# Patient Record
Sex: Female | Born: 1985 | Race: White | Hispanic: No | Marital: Married | Smoking: Never smoker
Health system: Southern US, Community
[De-identification: ages and names within clinical notes are randomized; demographics above are authoritative.]

## PROBLEM LIST (undated history)

## (undated) DIAGNOSIS — N946 Dysmenorrhea, unspecified: Secondary | ICD-10-CM

## (undated) DIAGNOSIS — K3184 Gastroparesis: Secondary | ICD-10-CM

## (undated) DIAGNOSIS — F419 Anxiety disorder, unspecified: Secondary | ICD-10-CM

## (undated) DIAGNOSIS — N809 Endometriosis, unspecified: Secondary | ICD-10-CM

## (undated) DIAGNOSIS — I499 Cardiac arrhythmia, unspecified: Secondary | ICD-10-CM

## (undated) HISTORY — DX: Dysmenorrhea, unspecified: N94.6

## (undated) HISTORY — DX: Endometriosis, unspecified: N80.9

## (undated) HISTORY — DX: Cardiac arrhythmia, unspecified: I49.9

## (undated) HISTORY — DX: Anxiety disorder, unspecified: F41.9

## (undated) HISTORY — PX: PELVIC LAPAROSCOPY: SHX162

---

## 2007-03-22 ENCOUNTER — Ambulatory Visit (HOSPITAL_COMMUNITY): Admission: RE | Admit: 2007-03-22 | Discharge: 2007-03-22 | Payer: Self-pay | Admitting: Gastroenterology

## 2007-04-20 ENCOUNTER — Encounter: Admission: RE | Admit: 2007-04-20 | Discharge: 2007-04-20 | Payer: Self-pay | Admitting: Gastroenterology

## 2007-04-21 ENCOUNTER — Encounter: Admission: RE | Admit: 2007-04-21 | Discharge: 2007-04-21 | Payer: Self-pay | Admitting: Gastroenterology

## 2010-02-10 ENCOUNTER — Encounter: Payer: Self-pay | Admitting: Gastroenterology

## 2010-06-04 NOTE — Op Note (Signed)
NAMEDALENE, ROBARDS               ACCOUNT NO.:  [id number]   MEDICAL RECORD NO.:  [id number]          PATIENT TYPE:  AMB   LOCATION:  ENDO                         FACILITY:  [facility]   PHYSICIAN:  Shirley Friar, MDDATE OF BIRTH:  26-Sep-1985   DATE OF PROCEDURE:  DATE OF DISCHARGE:                               OPERATIVE REPORT   PROCEDURES:  1. Upper endoscopy.  2. Bravo capsule placement.   INDICATIONS:  Heartburn.   MEDICATIONS:  Fentanyl 100 mcg IV, Versed 8 mg IV, Phenergan 12.5 mg IV.   FINDINGS:  Endoscope was inserted through the oropharynx and the  esophagus was intubated, which was normal in its entirety.  The  endoscope was advanced down to the stomach, which revealed normal  stomach mucosa.  Retroflexion was done, which revealed normal proximal  stomach.  Endoscope was straightened and advanced to the duodenal bulb  and down to the second portion of the duodenum, which were both normal.  Endoscope was withdrawn back into the esophagus and the GE junction was  noted be 40 cm from incisors.  The endoscope was then withdrawn and the  Bravo capsule catheter was inserted.  The Bravo capsule catheter was  advanced to 34 cm at the incisors and the capsule was deployed in the  usual fashion.  The catheter was withdrawn and the endoscope was  reinserted to confirm adequate placement of the Bravo capsule without  any immediate complications.   ASSESSMENT:  1. Normal upper endoscopy.  2. Status post Bravo capsule placement at 34 cm from incisors.   PLAN:  Follow up on Bravo results with Bravo procedure being done off of  proton pump inhibitor therapy.      Shirley Friar, MD  Electronically Signed     VCS/MEDQ  D:  03/22/2007  T:  03/23/2007  Job:  [id number]   cc:   Chales Salmon. Abigail Miyamoto, M.D.  Fax: [phone]

## 2011-02-20 ENCOUNTER — Ambulatory Visit (HOSPITAL_COMMUNITY)
Admission: RE | Admit: 2011-02-20 | Discharge: 2011-02-20 | Disposition: A | Discharge status: Home / Self Care | Payer: PRIVATE HEALTH INSURANCE | Source: Ambulatory Visit | Attending: Obstetrics and Gynecology | Admitting: Obstetrics and Gynecology

## 2011-02-20 NOTE — Progress Notes (Addendum)
Adult Lactation Consultation Outpatient Visit Note  Patient Name: Jeanne Conway "Jeanne Conway" Date of Birth: [birth year]  01/28/11 Gestational Age at Delivery: Unknown 38 weeks Type of Delivery: Cesarean  Breastfeeding History: Frequency of Breastfeeding: every 2 hrs  Length of Feeding: 10 mins each breast Voids: QS Stools: QS yellow seedy     Consultation Evaluation:  Initial Feeding Assessment: Pre-feed Weight: 3530  Post-feed Weight: 3582 Amount Transferred: 52 ml Comments: right breast.  When Jeanne Conway started to latch Jeanne Conway, her fingers were up close to nipple sandwiching the areola.  Showed Mom how to support, and sandwich breast closer to chest wall.  Jeanne Conway latched deeper and easier.  Demonstrated alternating breast compression to increase milk flow.  Mom had been feeding baby 10 mins per side.  Encouraged her to watch baby, not the clock, and to let Jeanne Conway end the feeding on the first side, burp and offer the second.  Baby has gained about 13 oz. In 18 days.  To feed more often during the night, as baby can sleep 5 hr stretches.    Additional Feeding Assessment: Pre-feed Weight: 3582 Post-feed Weight: 3616 Amount Transferred: 32 ml Comments: left breast  Total Breast milk Transferred this Visit: 84 ml.    Baby transferring milk well.  Mom comes in for OP visit with her 12 week old infant Jeanne Conway.  She is complaining of sore nipples, even after readjusting Jeanne Conway on the breast.  She states that initially baby caused soreness on latch only, but not it is painful most every feeding for last week.  Mom describes shooting pains during and after feedings.  Both nipples without trauma, but very pink compared to areola.  Mom has a plentiful milk supply, and frequently has soaking wet bra pads.  Mom just finished treatment for a vaginal yeast infection.  Baby has thick white coating on her mouth, and a slight bumpy diaper rash.  We discussed the possibility of a yeast overgrowth on her  nipples, and in baby's mouth.  Treatment of choice is Gentian Violet (Dr. Yevette Edwards protocol given).  Talked about probiotics, and dietary restrictions.  Jeanne Conway given handout on yeast treatment with lots of information.       Follow-Up  Call us prn    [name] 02/20/2011, 10:52 AM

## 2016-04-15 ENCOUNTER — Emergency Department (HOSPITAL_COMMUNITY)
Admission: EM | Admit: 2016-04-15 | Discharge: 2016-04-15 | Disposition: A | Discharge status: Home / Self Care | Payer: PRIVATE HEALTH INSURANCE | Attending: Emergency Medicine | Admitting: Emergency Medicine

## 2016-04-15 ENCOUNTER — Encounter (HOSPITAL_COMMUNITY): Payer: Self-pay | Admitting: *Deleted

## 2016-04-15 ENCOUNTER — Emergency Department (HOSPITAL_COMMUNITY): Payer: PRIVATE HEALTH INSURANCE

## 2016-04-15 DIAGNOSIS — R519 Headache, unspecified: Secondary | ICD-10-CM

## 2016-04-15 DIAGNOSIS — R55 Syncope and collapse: Secondary | ICD-10-CM

## 2016-04-15 DIAGNOSIS — R51 Headache: Secondary | ICD-10-CM | POA: Insufficient documentation

## 2016-04-15 HISTORY — DX: Gastroparesis: K31.84

## 2016-04-15 LAB — BASIC METABOLIC PANEL
ANION GAP: 9 (ref 5–15)
BUN: 10 mg/dL (ref 6–20)
CALCIUM: 9.4 mg/dL (ref 8.9–10.3)
CO2: 26 mmol/L (ref 22–32)
CREATININE: 0.56 mg/dL (ref 0.44–1.00)
Chloride: 101 mmol/L (ref 101–111)
GFR calc non Af Amer: >60 mL/min (ref >60)
Glucose, Bld: 149 mg/dL — ABNORMAL HIGH (ref 65–99)
Potassium: 3.3 mmol/L — ABNORMAL LOW (ref 3.5–5.1)
SODIUM: 136 mmol/L (ref 135–145)

## 2016-04-15 LAB — CBC
HCT: 37.7 % (ref 36.0–46.0)
HEMOGLOBIN: 13 g/dL (ref 12.0–15.0)
MCH: 31.3 pg (ref 26.0–34.0)
MCHC: 34.5 g/dL (ref 30.0–36.0)
MCV: 90.6 fL (ref 78.0–100.0)
PLATELETS: 301 10*3/uL (ref 150–400)
RBC: 4.16 MIL/uL (ref 3.87–5.11)
RDW: 11.8 % (ref 11.5–15.5)
WBC: 8.7 10*3/uL (ref 4.0–10.5)

## 2016-04-15 LAB — I-STAT BETA HCG BLOOD, ED (MC, WL, AP ONLY)

## 2016-04-15 MED ORDER — METOCLOPRAMIDE HCL 5 MG/ML IJ SOLN
10.0000 mg | Freq: Once | INTRAMUSCULAR | Status: AC
  Administered 2016-04-15: 10 mg via INTRAVENOUS
  Filled 2016-04-15: qty 2

## 2016-04-15 MED ORDER — KETOROLAC TROMETHAMINE 30 MG/ML IJ SOLN
30.0000 mg | Freq: Once | INTRAMUSCULAR | Status: AC
  Administered 2016-04-15: 30 mg via INTRAVENOUS
  Filled 2016-04-15: qty 1

## 2016-04-15 MED ORDER — METOCLOPRAMIDE HCL 10 MG PO TABS: 10.0000 mg | ORAL_TABLET | Freq: Four times a day (QID) | ORAL | 0 refills | Status: DC

## 2016-04-15 MED ORDER — SODIUM CHLORIDE 0.9 % IV SOLN
Freq: Once | INTRAVENOUS | Status: AC
  Administered 2016-04-15: 100 mL/h via INTRAVENOUS

## 2016-04-15 MED ORDER — DIPHENHYDRAMINE HCL 50 MG/ML IJ SOLN
12.5000 mg | Freq: Once | INTRAMUSCULAR | Status: AC
  Administered 2016-04-15: 12.5 mg via INTRAVENOUS
  Filled 2016-04-15: qty 1

## 2016-04-15 NOTE — ED Provider Notes (Signed)
[facility]-EMERGENCY DEPT Provider Note   CSN: [id number] Arrival date & time: 04/15/16  1522     History   Chief Complaint Chief Complaint  Patient presents with  . Near Syncope    HPI Jeanne Conway is a 31 y.o. female.  This a 31 year old female who presents with a near syncopal episode.  She states that she's had headache all day and when she stood up she became very shaky/visibly shaking to bystanders, as well as feeling like she was going to pass out.  EMS was called again when she attempted to stand.  She became shaky, nausea, increased and she had one episode of vomiting. Patient states that she normally gets headaches but is normally able to shake them off this one has been unrelenting.  All day.  His frontal and the back of her neck school without any visual disturbances.  She has not taken any medication for her headache.  Patient denied any chest pain, irregular heartbeat, visual changes, shortness of breath, diaphoresis. She states that she had not eaten anything except a few french fries all day EMS checked her blood sugar on arrival and was within normal parameters. Patient has a strong family history on her parental side of TIA, stroke, type 2 diabetes Denies any DVT/PE risk factors-no hormone replacement therapy, no recent travel, no trauma or injuries      Past Medical History:  Diagnosis Date  . Gastroparesis     There are no active problems to display for this patient.   History reviewed. No pertinent surgical history.  OB History    No data available       Home Medications    Prior to Admission medications   Medication Sig Start Date End Date Taking? Authorizing Provider  metoCLOPramide (REGLAN) 10 MG tablet Take 1 tablet (10 mg total) by mouth every 6 (six) hours. 04/15/16   Earley FavorGail Kaytlynn Kochan, NP    Family History History reviewed. No pertinent family history.  Social History Social History  Substance Use Topics  . Smoking status: Never Smoker  .  Smokeless tobacco: Never Used  . Alcohol use Not on file     Allergies   Patient has no allergy information on record.   Review of Systems Review of Systems  Constitutional: Negative for chills and fever.  Respiratory: Negative for shortness of breath.   Cardiovascular: Negative for chest pain and leg swelling.  Gastrointestinal: Positive for nausea. Negative for abdominal pain.  Genitourinary: Negative for dysuria.  Musculoskeletal: Negative for myalgias and neck pain.  Neurological: Positive for syncope and headaches. Negative for speech difficulty and weakness.  All other systems reviewed and are negative.    Physical Exam Updated Vital Signs BP 119/82   Pulse 72   Temp 98.1 F (36.7 C) (Oral)   Resp 18   Ht 5\' 5"  (1.651 m)   Wt 70.8 kg   LMP 04/07/2016 (Approximate)   SpO2 99%   BMI 25.96 kg/m   Physical Exam  Constitutional: She appears well-developed and well-nourished. No distress.  HENT:  Head: Normocephalic.  Right Ear: External ear normal.  Left Ear: External ear normal.  Eyes: Pupils are equal, round, and reactive to light.  Neck: Normal range of motion.  Cardiovascular: Normal rate and regular rhythm.   Pulmonary/Chest: Effort normal.  Abdominal: Soft.  Musculoskeletal: Normal range of motion.  Neurological: She is alert.  Skin: Skin is warm.  Psychiatric: She has a normal mood and affect.  Nursing note and vitals reviewed.  ED Treatments / Results  Labs (all labs ordered are listed, but only abnormal results are displayed) Labs Reviewed  BASIC METABOLIC PANEL - Abnormal; Notable for the following:       Result Value   Potassium 3.3 (*)    Glucose, Bld 149 (*)    All other components within normal limits  CBC  URINALYSIS, ROUTINE W REFLEX MICROSCOPIC  I-STAT BETA HCG BLOOD, ED ([facility], [facility], [facility] ONLY)  CBG MONITORING, ED    EKG  EKG Interpretation None       Radiology Ct Head Wo Contrast  Result Date: 04/15/2016 CLINICAL DATA:   Headache, syncope EXAM: CT HEAD WITHOUT CONTRAST TECHNIQUE: Contiguous axial images were obtained from the base of the skull through the vertex without intravenous contrast. COMPARISON:  None. FINDINGS: Brain: No intracranial hemorrhage, mass effect or midline shift. No acute cortical infarction. No hydrocephalus. No mass lesion is noted on this unenhanced scan. The gray and white-matter differentiation is preserved. No intra or extra-axial fluid collection. Vascular: No hyperdense vessel or unexpected calcification. Skull: Normal. Negative for fracture or focal lesion. Sinuses/Orbits: No acute finding. Other: None. IMPRESSION: No acute intracranial abnormality. Electronically Signed   By: Natasha Mead M.D.   On: 04/15/2016 21:02    Procedures Procedures (including critical care time)  Medications Ordered in ED Medications  0.9 %  sodium chloride infusion (100 mL/hr Intravenous New Bag/Given 04/15/16 2109)  metoCLOPramide (REGLAN) injection 10 mg (10 mg Intravenous Given 04/15/16 2109)  diphenhydrAMINE (BENADRYL) injection 12.5 mg (12.5 mg Intravenous Given 04/15/16 2109)  ketorolac (TORADOL) 30 MG/ML injection 30 mg (30 mg Intravenous Given 04/15/16 2109)     Initial Impression / Assessment and Plan / ED Course  I have reviewed the triage vital signs and the nursing notes.  Pertinent labs & imaging results that were available during my care of the patient were reviewed by me and considered in my medical decision making (see chart for details).      Patient reports that when she got out of the bed to sit in a wheelchair for CT scan.  She became lightheaded Review of labs and head CT within normal parameters.  Patient was given headache cocktail and some IV fluid after which she slept for approximately one hour and is now back to baseline.  Ambulated without any return of symptoms  Final Clinical Impressions(s) / ED Diagnoses   Final diagnoses:  Near syncope  Acute nonintractable headache,  unspecified headache type    New Prescriptions New Prescriptions   METOCLOPRAMIDE (REGLAN) 10 MG TABLET    Take 1 tablet (10 mg total) by mouth every 6 (six) hours.     Earley Favor, NP 04/15/16 6962    Mancel Bale, MD 04/16/16 [phone]

## 2016-04-15 NOTE — ED Notes (Signed)
Patient transported to CT 

## 2016-04-15 NOTE — Discharge Instructions (Signed)
Glad your feeling better If you decide to investigate your headaches further you have been given a referral the [facility] You have been given a prescription for Reglan that you can take at the onset of headaches with 12.5 mg Benadryl this is usually enough to stop the headache and nausea  Follow up with your PCP as needed

## 2016-04-15 NOTE — ED Triage Notes (Signed)
Pt in c/o dizziness and fatigue that started today, states she is shaking and cannot stop, also had near syncopal episode today. Friends thought maybe her glucose was low but when checked it was in the 100's. Alert and oriented, no distress noted

## 2017-04-01 IMAGING — CT CT HEAD W/O CM
4 series · 16 of 47 positions shown, 18 images · non-contrast
Comparison: None.

CLINICAL DATA: Headache, syncope

EXAM:
CT HEAD WITHOUT CONTRAST
TECHNIQUE: Contiguous axial images were obtained from the base of the skull
through the vertex without intravenous contrast.

[Series 3: head without · axial · non-contrast · 0.40mm/px · z∈[-215,-95]mm · 7 of 32 slices shown, 9 images]
[im 4/32  brain]
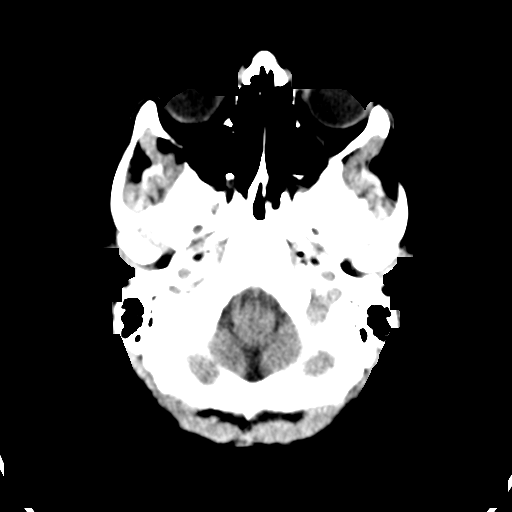
[im 4/32  bone]
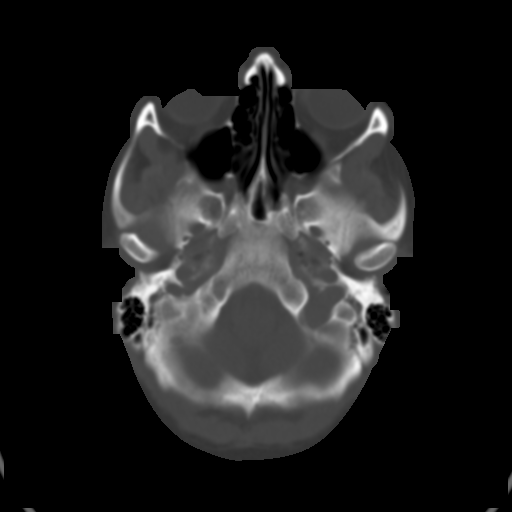
[im 8/32  brain]
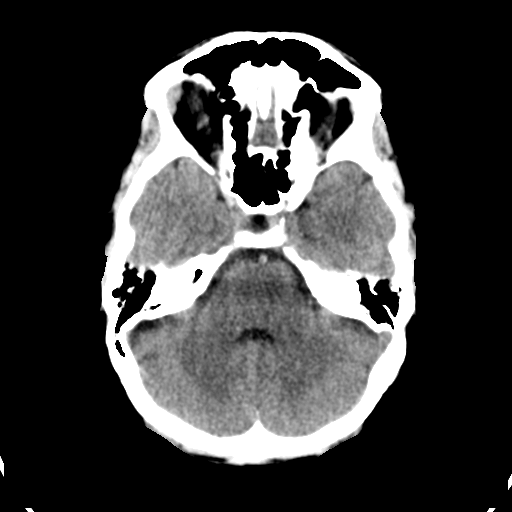
[im 12/32  brain]
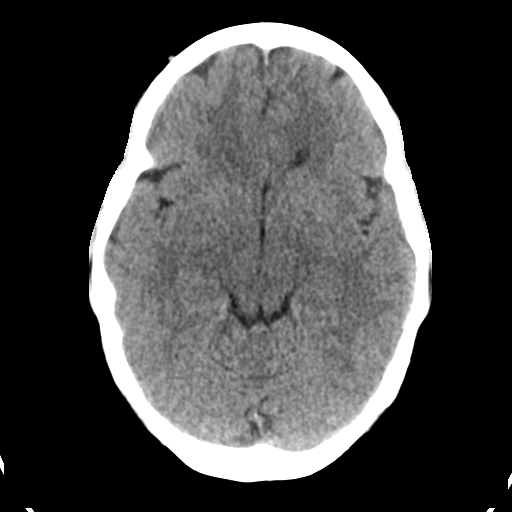
[im 16/32  brain]
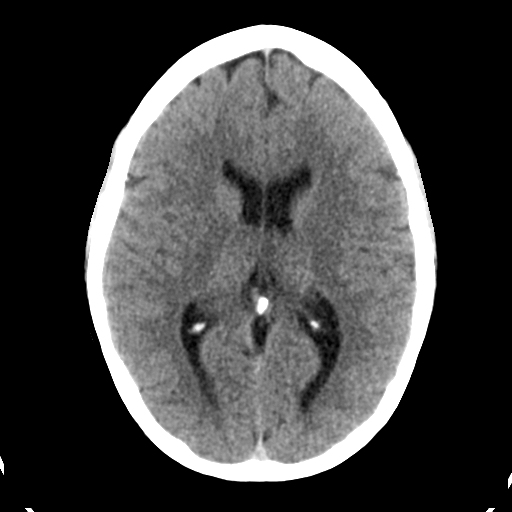
[im 20/32  brain]
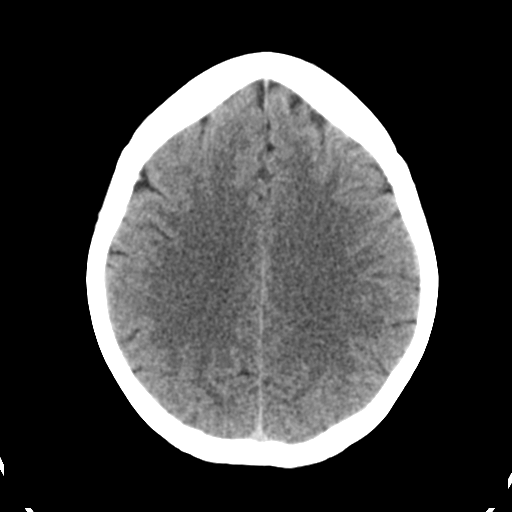
[im 20/32  bone]
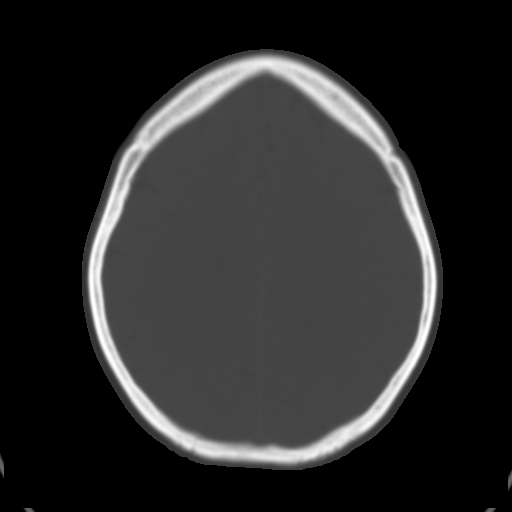
[im 24/32  brain]
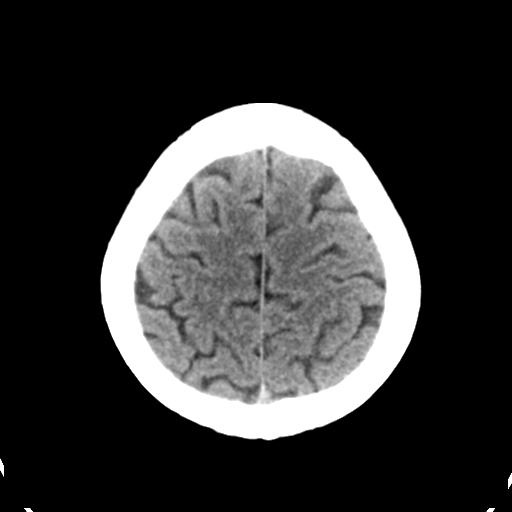
[im 28/32  brain]
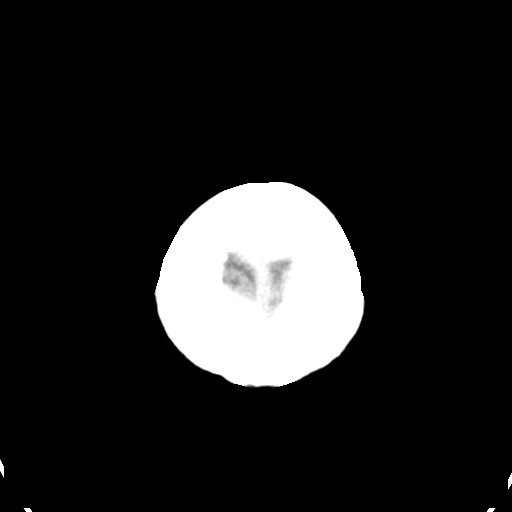

[Series 4: head bone · axial · 0.40mm/px · z∈[-216,-184]mm · 3 of 79 slices shown]
[im 8/79  bone]
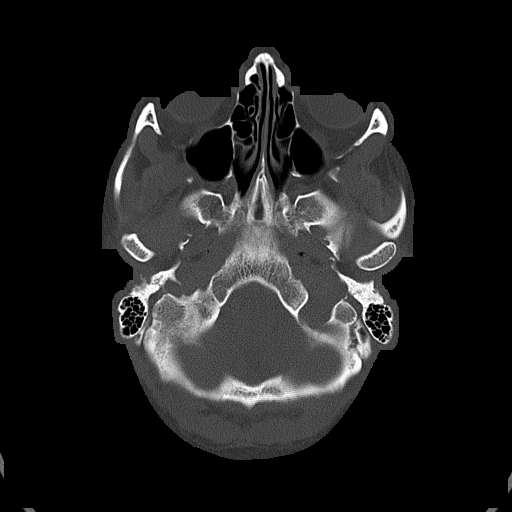
[im 16/79  bone]
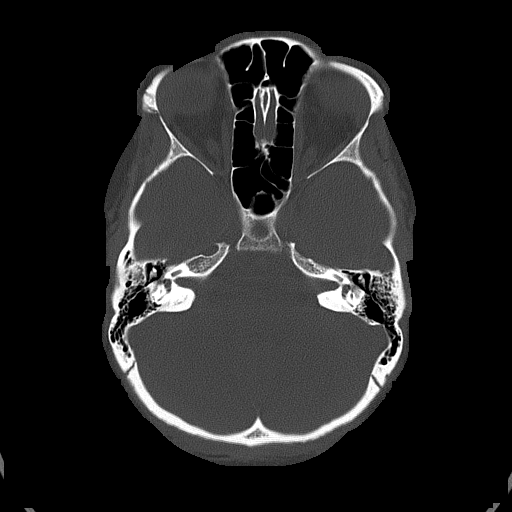
[im 24/79  bone]
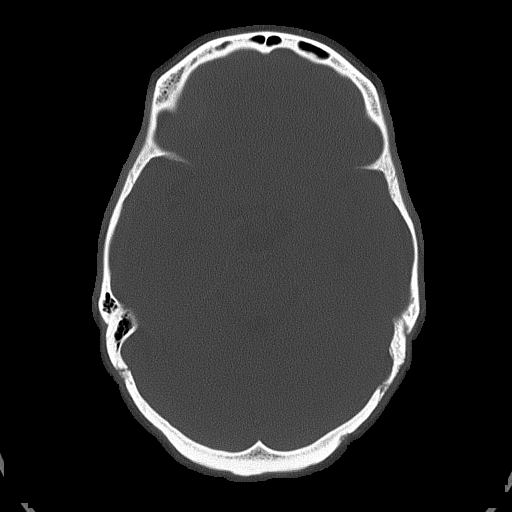

[Series 5: head without cor · coronal · non-contrast · 0.31mm/px · 3 of 67 slices shown]
[im 23/67  brain]
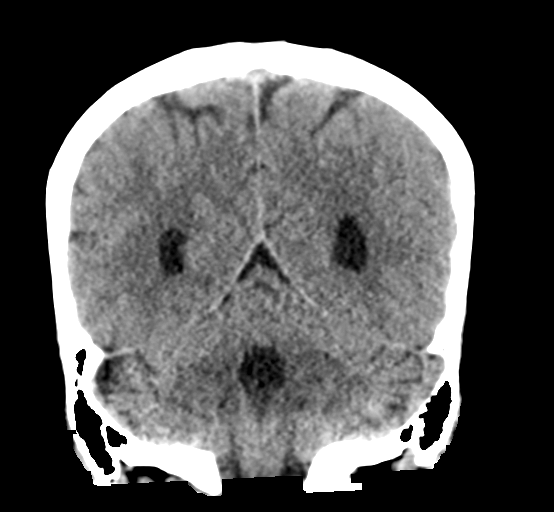
[im 30/67  brain]
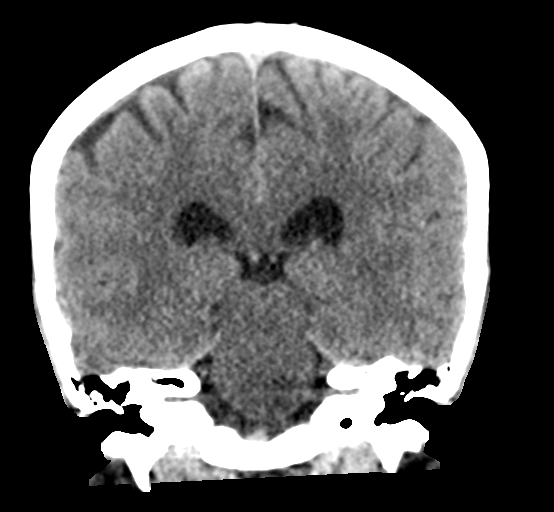
[im 37/67  brain]
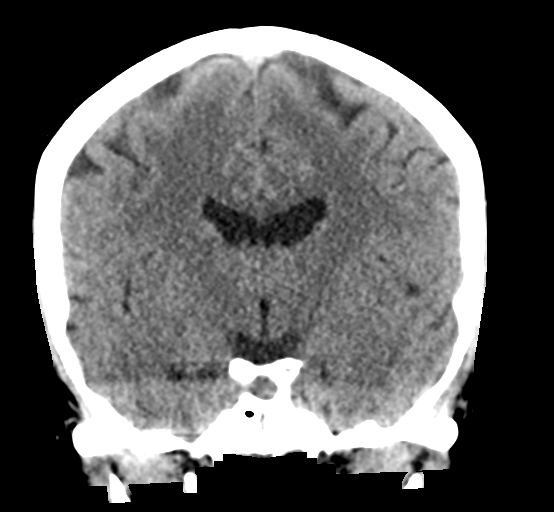

[Series 6: head without sag · sagittal · non-contrast · 0.30mm/px · 3 of 51 slices shown]
[im 17/51  brain]
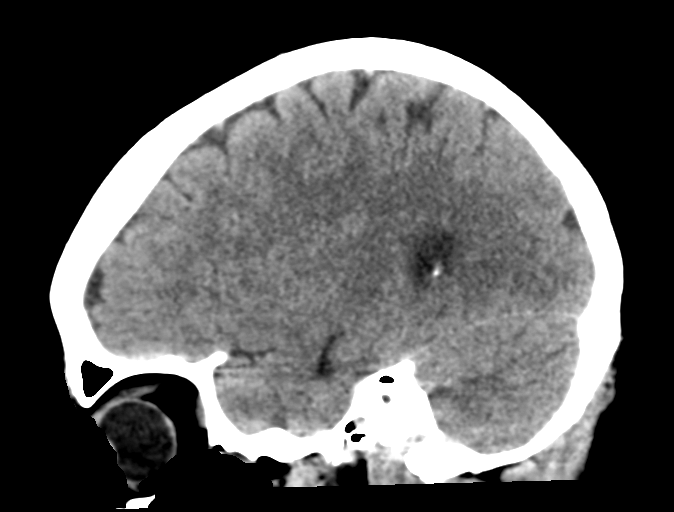
[im 26/51  brain]
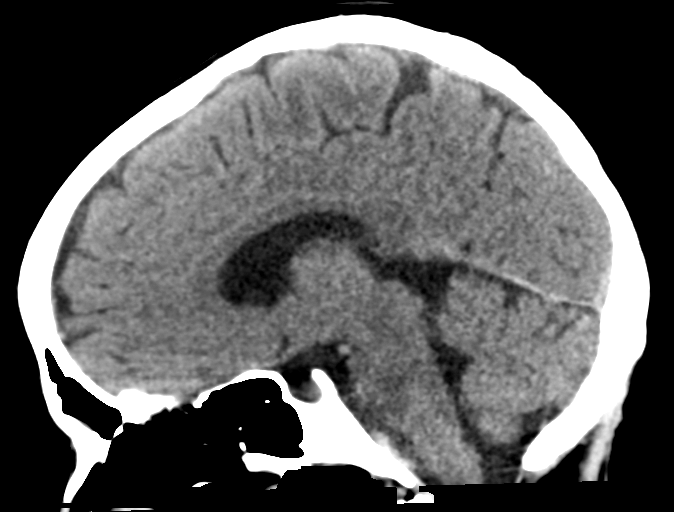
[im 34/51  brain]
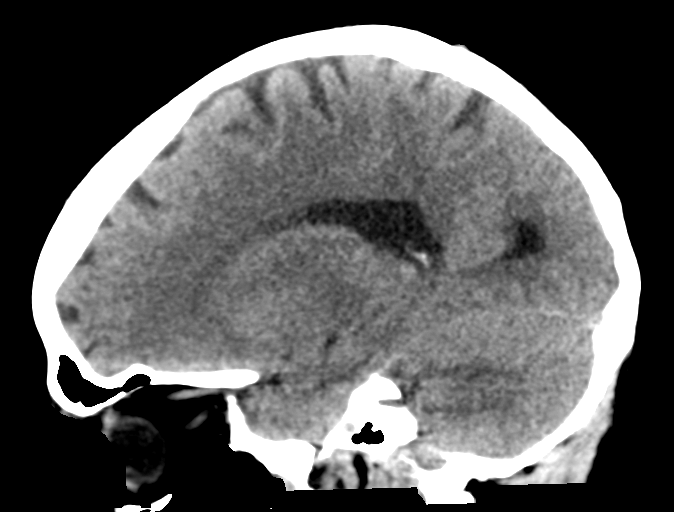

[16 of 47 positions shown; findings below may reference images not displayed]

FINDINGS: Brain: No intracranial hemorrhage, mass effect or midline shift. No
acute cortical infarction. No hydrocephalus. No mass lesion is noted
on this unenhanced scan. The gray and white-matter differentiation
is preserved. No intra or extra-axial fluid collection.

Vascular: No hyperdense vessel or unexpected calcification.

Skull: Normal. Negative for fracture or focal lesion.

Sinuses/Orbits: No acute finding.

Other: None.
IMPRESSION: No acute intracranial abnormality.

## 2018-12-01 ENCOUNTER — Ambulatory Visit (INDEPENDENT_AMBULATORY_CARE_PROVIDER_SITE_OTHER): Payer: Self-pay | Admitting: Obstetrics and Gynecology

## 2018-12-01 ENCOUNTER — Other Ambulatory Visit (HOSPITAL_COMMUNITY)
Admission: RE | Admit: 2018-12-01 | Discharge: 2018-12-01 | Disposition: A | Discharge status: Home / Self Care | Payer: PRIVATE HEALTH INSURANCE | Source: Ambulatory Visit | Attending: Obstetrics and Gynecology | Admitting: Obstetrics and Gynecology

## 2018-12-01 ENCOUNTER — Encounter: Payer: Self-pay | Admitting: Obstetrics and Gynecology

## 2018-12-01 ENCOUNTER — Other Ambulatory Visit: Payer: Self-pay

## 2018-12-01 VITALS — BP 118/62 | HR 76 | Temp 98.0°F | Resp 20 | Ht 65.5 in | Wt 164.0 lb

## 2018-12-01 DIAGNOSIS — N760 Acute vaginitis: Secondary | ICD-10-CM

## 2018-12-01 DIAGNOSIS — Z124 Encounter for screening for malignant neoplasm of cervix: Secondary | ICD-10-CM

## 2018-12-01 DIAGNOSIS — Z01419 Encounter for gynecological examination (general) (routine) without abnormal findings: Secondary | ICD-10-CM | POA: Insufficient documentation

## 2018-12-01 MED ORDER — IBUPROFEN 800 MG PO TABS: 800.0000 mg | ORAL_TABLET | Freq: Three times a day (TID) | ORAL | 3 refills | Status: AC | PRN

## 2018-12-01 NOTE — Patient Instructions (Signed)

## 2018-12-01 NOTE — Progress Notes (Signed)
33 y.o. G36P0012 Married Caucasian female here for annual exam.    Patient complaining of vaginal discharge with odor and burning. She has tried OTC medications without much relief.  Symptoms for 2 months.  History of bacterial vaginosis and yeast infections.   She was seen in Urgent Care last week for abscess on upper left leg and had lanced and placed on Bactrim DS.    Feels the abscess is much better. She felt bad yesterday, low grade fever, stomach ache. She is worried something is wrong.   Hx endometriosis.  Menses every 28 days.  Used Depo Provera for 5 years.  Hx laparoscopy x 2.  Did a Lurpron treatment.  Uses Tylenol usually.   PCP:   None  Patient's last menstrual period was 11/21/2018 (exact date).           Sexually active: Yes.    The current method of family planning is vasectomy.    Exercising: No.  The patient does not participate in regular exercise at present. Smoker:  no  Health Maintenance: Pap: 2014 normal History of abnormal Pap:  no MMG:  n/a Colonoscopy:  n/a BMD:   n/a  Result  n/a TDaP:  2012 Gardasil:   no HIV:Neg with Pregnancy Hep C:no Screening Labs:  Some today.   reports that she has never smoked. She has never used smokeless tobacco. She reports current alcohol use of about 2.0 standard drinks of alcohol per week. She reports that she does not use drugs.  Past Medical History:  Diagnosis Date  . Anxiety   . Dysmenorrhea   . Endometriosis   . Gastroparesis   . Irregular heart rhythm     Past Surgical History:  Procedure Laterality Date  . CESAREAN SECTION  2013, 2014  . PELVIC LAPAROSCOPY     x2 Dx'd with endometriosis in [place]    Current Outpatient Medications  Medication Sig Dispense Refill  . Cholecalciferol (VITAMIN D3) 125 MCG (5000 UT) CAPS Take 1 capsule by mouth daily.    Marland Kitchen lactobacillus acidophilus (BACID) TABS tablet Take 2 tablets by mouth 3 (three) times daily.    Marland Kitchen sulfamethoxazole-trimethoprim (BACTRIM DS)  800-160 MG tablet Take 1 tablet by mouth 2 (two) times daily.    Marland Kitchen zinc gluconate 50 MG tablet Take 50 mg by mouth daily.     No current facility-administered medications for this visit.     Family History  Problem Relation Age of Onset  . Cancer Mother 68       Rectal ca  . Hyperlipidemia Father   . Stroke Father   . Diabetes Paternal Grandmother   . Hypertension Paternal Grandfather   . Hyperlipidemia Paternal Grandfather     Review of Systems  All other systems reviewed and are negative.   Exam:   BP 118/62   Pulse 76   Temp 98 F (36.7 C) (Temporal)   Resp 20   Ht 5' 5.5" (1.664 m)   Wt 164 lb (74.4 kg)   LMP 11/21/2018 (Exact Date)   BMI 26.88 kg/m     General appearance: alert, cooperative and appears stated age Head: normocephalic, without obvious abnormality, atraumatic Neck: no adenopathy, supple, symmetrical, trachea midline and thyroid normal to inspection and palpation Lungs: clear to auscultation bilaterally Breasts: normal appearance, no masses or tenderness, No nipple retraction or dimpling, No nipple discharge or bleeding, No axillary adenopathy Heart: regular rate and rhythm Abdomen: soft, non-tender; no masses, no organomegaly Extremities: extremities normal, atraumatic, no cyanosis  or edema Skin: skin color, texture, turgor normal.  Pin point spot on skin of left medial thigh close to vulva.  No induration of fluctuance. Lymph nodes: cervical, supraclavicular, and axillary nodes normal. Neurologic: grossly normal  Pelvic: External genitalia:  no lesions              No abnormal inguinal nodes palpated.              Urethra:  normal appearing urethra with no masses, tenderness or lesions              Bartholins and Skenes: normal                 Vagina: normal appearing vagina with normal color and discharge, no lesions              Cervix: no lesions              Pap taken: Yes.   Bimanual Exam:  Uterus:  normal size, contour, position,  consistency, mobility, non-tender              Adnexa: no mass, fullness, tenderness            Chaperone was present for exam.  Assessment:   Well woman visit with normal exam. Dysmenorrhea.  Hx endometriosis.  Thigh abscess.   Resolved with Bactrim.  Vaginitis.   Plan: Mammogram screening discussed. Self breast awareness reviewed. Pap and HR HPV as above. Guidelines for Calcium, Vitamin D, regular exercise program including cardiovascular and weight bearing exercise. I discussed gardasil vaccine.  Affirm.  CBC with diff and CMP.  Motrin 800 mg po q 8 hours prn.  She declines other treatment for pelvic pain/dysmenorrha. Follow up annually and prn.   After visit summary provided.

## 2018-12-02 LAB — VAGINITIS/VAGINOSIS, DNA PROBE
Candida Species: NEGATIVE
Gardnerella vaginalis: NEGATIVE
Trichomonas vaginosis: NEGATIVE

## 2018-12-02 LAB — CBC WITH DIFFERENTIAL/PLATELET
Basophils Absolute: 0 10*3/uL (ref 0.0–0.2)
Basos: 0 %
EOS (ABSOLUTE): 0.1 10*3/uL (ref 0.0–0.4)
Eos: 1 %
Hematocrit: 36.1 % (ref 34.0–46.6)
Hemoglobin: 12.1 g/dL (ref 11.1–15.9)
Immature Grans (Abs): 0 10*3/uL (ref 0.0–0.1)
Immature Granulocytes: 0 %
Lymphocytes Absolute: 1.4 10*3/uL (ref 0.7–3.1)
Lymphs: 24 %
MCH: 31.9 pg (ref 26.6–33.0)
MCHC: 33.5 g/dL (ref 31.5–35.7)
MCV: 95 fL (ref 79–97)
Monocytes Absolute: 0.4 10*3/uL (ref 0.1–0.9)
Monocytes: 7 %
Neutrophils Absolute: 4 10*3/uL (ref 1.4–7.0)
Neutrophils: 68 %
Platelets: 361 10*3/uL (ref 150–450)
RBC: 3.79 x10E6/uL (ref 3.77–5.28)
RDW: 11.8 % (ref 11.7–15.4)
WBC: 6 10*3/uL (ref 3.4–10.8)

## 2018-12-02 LAB — COMPREHENSIVE METABOLIC PANEL
ALT: 8 IU/L (ref 0–32)
AST: 13 IU/L (ref 0–40)
Albumin/Globulin Ratio: 1.8 (ref 1.2–2.2)
Albumin: 4.4 g/dL (ref 3.8–4.8)
Alkaline Phosphatase: 68 IU/L (ref 39–117)
BUN/Creatinine Ratio: 19 (ref 9–23)
BUN: 12 mg/dL (ref 6–20)
Bilirubin Total: <0.2 mg/dL (ref 0.0–1.2)
CO2: 26 mmol/L (ref 20–29)
Calcium: 9 mg/dL (ref 8.7–10.2)
Chloride: 102 mmol/L (ref 96–106)
Creatinine, Ser: 0.63 mg/dL (ref 0.57–1.00)
GFR calc Af Amer: 135 mL/min/{1.73_m2} (ref >59)
GFR calc non Af Amer: 117 mL/min/{1.73_m2} (ref >59)
Globulin, Total: 2.5 g/dL (ref 1.5–4.5)
Glucose: 67 mg/dL (ref 65–99)
Potassium: 4.3 mmol/L (ref 3.5–5.2)
Sodium: 141 mmol/L (ref 134–144)
Total Protein: 6.9 g/dL (ref 6.0–8.5)

## 2018-12-03 LAB — CYTOLOGY - PAP
Comment: NEGATIVE
Diagnosis: NEGATIVE
High risk HPV: NEGATIVE

## 2019-04-27 ENCOUNTER — Telehealth: Payer: Self-pay | Admitting: Obstetrics and Gynecology

## 2019-04-27 NOTE — Telephone Encounter (Signed)
Spoke with pt. LMP: 3/19.Contraception: Vasectomy x 6.5 years.   Pt reports having  sx of  Urinary frequency, pressure, vaginal itching and small amount of urine when voiding x 1 week. Pt also  had small amount of brown discharge with slight odor that she noticed after having SA that is only seen after intercourse. Pt was last SA on 4/5. Denies pain or other vaginal bleeding, fever, chills, back pain. Advised pt will need to be seen in office for further evaluation. Pt states she started Monistat 3 today as she feels she has a yeast infection. Would like to increase water intake and see if sx resolve. Declines OV at this time. Advised pt if sx continue or worsen, will need to be seen in office or at local urgent care. Pt to call back to office on 4/12 to give update on sx if not resolved or been seen for evaluation. Pt agreeable and verbalized understanding.   Routing to Dr Edward Jolly for review.

## 2019-04-27 NOTE — Telephone Encounter (Signed)
Patient thinks she may have a yeast infection and uti. To triage to assist with scheduling.

## 2019-04-28 NOTE — Telephone Encounter (Signed)
Encounter reviewed and closed.  

## 2019-04-29 ENCOUNTER — Encounter: Payer: Self-pay | Admitting: Obstetrics & Gynecology

## 2019-04-29 ENCOUNTER — Telehealth: Payer: Self-pay | Admitting: Obstetrics and Gynecology

## 2019-04-29 ENCOUNTER — Other Ambulatory Visit: Payer: Self-pay

## 2019-04-29 ENCOUNTER — Ambulatory Visit (INDEPENDENT_AMBULATORY_CARE_PROVIDER_SITE_OTHER): Payer: Self-pay | Admitting: Obstetrics & Gynecology

## 2019-04-29 VITALS — BP 110/68 | HR 68 | Temp 98.2°F | Resp 16 | Wt 170.0 lb

## 2019-04-29 DIAGNOSIS — N898 Other specified noninflammatory disorders of vagina: Secondary | ICD-10-CM

## 2019-04-29 DIAGNOSIS — T192XXA Foreign body in vulva and vagina, initial encounter: Secondary | ICD-10-CM

## 2019-04-29 DIAGNOSIS — R102 Pelvic and perineal pain: Secondary | ICD-10-CM

## 2019-04-29 LAB — POCT URINALYSIS DIPSTICK
Bilirubin, UA: NEGATIVE
Blood, UA: NEGATIVE
Glucose, UA: NEGATIVE
Ketones, UA: NEGATIVE
Leukocytes, UA: NEGATIVE
Nitrite, UA: NEGATIVE
Protein, UA: NEGATIVE
Urobilinogen, UA: NEGATIVE E.U./dL — AB
pH, UA: 5 (ref 5.0–8.0)

## 2019-04-29 MED ORDER — METRONIDAZOLE 500 MG PO TABS: 500.0000 mg | ORAL_TABLET | Freq: Two times a day (BID) | ORAL | 0 refills | Status: DC

## 2019-04-29 NOTE — Telephone Encounter (Signed)
Patient calling to give update

## 2019-04-29 NOTE — Telephone Encounter (Signed)
Returned call to pt. Pt given recommendations for OV per Dr Hyacinth Meeker. Pt agreeable. Pt scheduled for OV for evaluation on 4/9 at 4:15 pm. Pt verbalized understanding for date and time. CPS Neg.   Routing to Dr Hyacinth Meeker for review.  Encounter closed.  Cc: Dr Edward Jolly

## 2019-04-29 NOTE — Progress Notes (Signed)
GYNECOLOGY  VISIT  CC:   Retained tampon, vaginal odor  HPI: 34 y.o. G52P0012 Married White or Caucasian female here for check for infection.  Patient reports she was using Monistat for possible vaginal infection and after 3 days of use, she started to have some vaginal discomfort and ended up passing a tampon that she thinks has been present for several weeks.  Reports her best friend passed away about a month ago when she thinks she must left a tampon in and continue to place tampons but never remove the one that was retained.  She has recovered from Covid but does not have a sense of smell at this point.  When she read the tampon her husband noticed significant odor and she called.  She is having some mild right lower quadrant pain.  Currently has no vaginal discharge or vaginal bleeding.  GYNECOLOGIC HISTORY: No LMP recorded. (Menstrual status: Irregular Periods). Contraception: husband vasectomy Menopausal hormone therapy: none poct urine-neg  There are no problems to display for this patient.   Past Medical History:  Diagnosis Date  . Anxiety   . Dysmenorrhea   . Endometriosis   . Gastroparesis   . Irregular heart rhythm     Past Surgical History:  Procedure Laterality Date  . CESAREAN SECTION  2013, 2014  . PELVIC LAPAROSCOPY     x2 Dx'd with endometriosis in [place]    MEDS:   Current Outpatient Medications on File Prior to Visit  Medication Sig Dispense Refill  . Cholecalciferol (VITAMIN D3) 125 MCG (5000 UT) CAPS Take 1 capsule by mouth daily.    Marland Kitchen ibuprofen (ADVIL) 800 MG tablet Take 1 tablet (800 mg total) by mouth every 8 (eight) hours as needed. 30 tablet 3  . lactobacillus acidophilus (BACID) TABS tablet Take 2 tablets by mouth 3 (three) times daily.    Marland Kitchen sulfamethoxazole-trimethoprim (BACTRIM DS) 800-160 MG tablet Take 1 tablet by mouth 2 (two) times daily.    Marland Kitchen zinc gluconate 50 MG tablet Take 50 mg by mouth daily.     No current facility-administered  medications on file prior to visit.    ALLERGIES: Oxycodone-acetaminophen  Family History  Problem Relation Age of Onset  . Cancer Mother 43       Rectal ca  . Hyperlipidemia Father   . Stroke Father   . Diabetes Paternal Grandmother   . Hypertension Paternal Grandfather   . Hyperlipidemia Paternal Grandfather     SH: Married, non-smoker  Review of Systems  Genitourinary: Positive for frequency and pelvic pain.    PHYSICAL EXAMINATION:   Vitals:   04/29/19 1630  BP: 110/68  Pulse: 68  Resp: 16  Temp: 98.2 F (36.8 C)   General appearance: alert, cooperative and appears stated age Lymph:  no inguinal LAD noted  Pelvic: External genitalia:  no lesions              Urethra:  normal appearing urethra with no masses, tenderness or lesions              Bartholins and Skenes: normal                 Vagina: normal appearing vagina with normal color and discharge, no lesions              Cervix: no lesions              Bimanual Exam:  Uterus:  normal size, contour, position, consistency, mobility, non-tender  Adnexa: no mass, fullness, tenderness              Chaperone, Dorethea Clan, CMA, was present for exam.  Assessment: Possible vaginitis after retained tampon was removed Mild right lower quadrant pain that does seem lateral to her ovary  Plan: Vaginitis testing obtained today  Flagyl 500 mg twice daily x7 days sent to pharmacy on file.  I have asked her to call and give an update on Monday

## 2019-04-29 NOTE — Telephone Encounter (Signed)
Spoke with pt. Pt calling to give update from triage call dated 04/27/2019. Pt states finishing Monistat 3 for possible yeast infection today.   Pt reports having nausea, diarrhea x 1, back cramps and vaginal pressure this am while using restroom and passed old tampon. Reports tampon has been in place for at least 20 days since last cycle. Pt was not aware of lost tampon. Pt states intact tampon. Pt states husband had strong odor when tampon came out. Pt denies odor due to Covid + hx and has not restored smell. Covid + 08/2018. Denies fever, chilld and vomiting.   Pt now only having light right side pain described as pressure. Will discuss with Dr Hyacinth Meeker to see if needs OV for further evaluation and potential risk of infection. Pt agreeable.   Routing to Dr Hyacinth Meeker.

## 2019-04-30 LAB — VAGINITIS/VAGINOSIS, DNA PROBE
Candida Species: NEGATIVE
Gardnerella vaginalis: NEGATIVE
Trichomonas vaginosis: NEGATIVE

## 2019-09-06 ENCOUNTER — Encounter: Payer: Self-pay | Admitting: Obstetrics and Gynecology

## 2019-09-06 ENCOUNTER — Other Ambulatory Visit: Payer: Self-pay

## 2019-09-06 ENCOUNTER — Ambulatory Visit: Payer: Self-pay | Admitting: Obstetrics and Gynecology

## 2019-09-06 ENCOUNTER — Telehealth: Payer: Self-pay | Admitting: Obstetrics and Gynecology

## 2019-09-06 ENCOUNTER — Ambulatory Visit (INDEPENDENT_AMBULATORY_CARE_PROVIDER_SITE_OTHER): Payer: Self-pay | Admitting: Obstetrics and Gynecology

## 2019-09-06 ENCOUNTER — Telehealth: Payer: Self-pay

## 2019-09-06 ENCOUNTER — Ambulatory Visit (INDEPENDENT_AMBULATORY_CARE_PROVIDER_SITE_OTHER): Payer: Self-pay

## 2019-09-06 VITALS — BP 100/58 | HR 70 | Ht 65.5 in | Wt 176.8 lb

## 2019-09-06 DIAGNOSIS — K625 Hemorrhage of anus and rectum: Secondary | ICD-10-CM

## 2019-09-06 DIAGNOSIS — R102 Pelvic and perineal pain: Secondary | ICD-10-CM

## 2019-09-06 DIAGNOSIS — Z800 Family history of malignant neoplasm of digestive organs: Secondary | ICD-10-CM

## 2019-09-06 DIAGNOSIS — R1032 Left lower quadrant pain: Secondary | ICD-10-CM

## 2019-09-06 NOTE — Telephone Encounter (Signed)
Reviewed with Dr Edward Jolly. Ok to proceed.  Encounter closed.

## 2019-09-06 NOTE — Telephone Encounter (Signed)
AEX 11/2018, next 12/01/19 with BS Hx: Endometriosis  Hx: Ovarian cyst  Spoke with pt. Pt states having abd cramps/pelvic pain, mostly on left side that she rates as 6-7 on pain scale since yesterday. Pt states checked ovulation calendar and that was last week. Pt LMP 08/21/19.  Pt denies any unusual cycles, all have been regular since retained tampon in 04/2019. Pt states also still having slight odor since tampon retention, but "doesn't feel right" since removed. Pt denies any vaginal bleeding with cramps, clots, vaginal discharge or odor, fever, chills, nausea or vomiting, but having loose stool.  Pt states taking OTC Tylenol and hasn't resolved sx.  Pt not on contraception, due to husband's vasectomy, but is SA.   Pt advised to have OV for further evaluation. Pt agreeable. Pt advised will possibly have PUS as well. Pt agreeable. Advised will review with Dr Edward Jolly and return call with any further recommendations. Pt agreeable. Reviewed with nursing supervisor Yvonna Alanis, RN. Pt scheduled with Dr Edward Jolly as work-in at 3:45pm with PUS at 430 pm if needed. Pt verbalized understanding.   Ok to proceed as scheduled?   Routing to Dr Edward Jolly.   Orders placed for PUS

## 2019-09-06 NOTE — Progress Notes (Signed)
GYNECOLOGY  VISIT   HPI: 34 y.o.   Married  Caucasian  female   [phone] with Patient's last menstrual period was 08/21/2019 (exact date).   here for LLQ pelvic pain, starting yesterday evening.   It feels like pressure.  Pushing down on it makes it go away.  It radiates across her lower abdomen.  This feels different from her usual menstrual cramping.  The pain was preceded by nothing.  States nausea.  Denies vomiting.   Last BM was yesterday.  No constipation.  Normally has BMs every 3 - 4 days.  She had blood in the stool one week ago.  Not straining.  She had diarrhea at the time.  Both the blood in the stool and the diarrhea resolved.  Hx gastroparesis.   No dysuria and no blood in urine. No hx renal stones.   Menses are every 28 days, and last 4 days long.  Has cramping for 4 -5 days prior to her cycle.  The first day is heavy with cramping.   No change in partners.   GYNECOLOGIC HISTORY: Patient's last menstrual period was 08/21/2019 (exact date). Contraception: Vasectomy Menopausal hormone therapy:  n/a Last mammogram: n/a Last pap smear: 12-01-18 Neg:Neg HR HPV, 2014 normal per patient        OB History    Gravida  3   Para  2   Term  2   Preterm      AB  1   Living  2     SAB  1   TAB      Ectopic      Multiple      Live Births                 There are no problems to display for this patient.   Past Medical History:  Diagnosis Date  . Anxiety   . Dysmenorrhea   . Endometriosis   . Gastroparesis   . Irregular heart rhythm     Past Surgical History:  Procedure Laterality Date  . CESAREAN SECTION  2013, 2014  . PELVIC LAPAROSCOPY     x2 Dx'd with endometriosis in [place]    Current Outpatient Medications  Medication Sig Dispense Refill  . ibuprofen (ADVIL) 800 MG tablet Take 1 tablet (800 mg total) by mouth every 8 (eight) hours as needed. 30 tablet 3  . lactobacillus acidophilus (BACID) TABS tablet Take 2 tablets by  mouth 3 (three) times daily.    . Multiple Vitamin (MULTIVITAMIN) capsule Take 1 capsule by mouth daily.     No current facility-administered medications for this visit.     ALLERGIES: Oxycodone-acetaminophen  Family History  Problem Relation Age of Onset  . Cancer Mother 2       Rectal ca  . Hyperlipidemia Father   . Stroke Father   . Diabetes Paternal Grandmother   . Hypertension Paternal Grandfather   . Hyperlipidemia Paternal Grandfather     Social History   Socioeconomic History  . Marital status: Married    Spouse name: Not on file  . Number of children: Not on file  . Years of education: Not on file  . Highest education level: Not on file  Occupational History  . Not on file  Tobacco Use  . Smoking status: Never Smoker  . Smokeless tobacco: Never Used  Vaping Use  . Vaping Use: Never used  Substance and Sexual Activity  . Alcohol use: Yes    Alcohol/week: 2.0 standard  drinks    Types: 2 Glasses of wine per week  . Drug use: Never  . Sexual activity: Yes    Comment: husband vasectomy  Other Topics Concern  . Not on file  Social History Narrative  . Not on file   Social Determinants of Health   Financial Resource Strain:   . Difficulty of Paying Living Expenses:   Food Insecurity:   . Worried About Programme researcher, broadcasting/film/video in the Last Year:   . Barista in the Last Year:   Transportation Needs:   . Freight forwarder (Medical):   Marland Kitchen Lack of Transportation (Non-Medical):   Physical Activity:   . Days of Exercise per Week:   . Minutes of Exercise per Session:   Stress:   . Feeling of Stress :   Social Connections:   . Frequency of Communication with Friends and Family:   . Frequency of Social Gatherings with Friends and Family:   . Attends Religious Services:   . Active Member of Clubs or Organizations:   . Attends Banker Meetings:   Marland Kitchen Marital Status:   Intimate Partner Violence:   . Fear of Current or Ex-Partner:   .  Emotionally Abused:   Marland Kitchen Physically Abused:   . Sexually Abused:     Review of Systems  Genitourinary: Positive for pelvic pain (LLQ pain).  All other systems reviewed and are negative.   PHYSICAL EXAMINATION:    BP (!) 100/58 (Cuff Size: Large)   Pulse 70   Ht 5' 5.5" (1.664 m)   Wt 176 lb 12.8 oz (80.2 kg)   LMP 08/21/2019 (Exact Date)   BMI 28.97 kg/m     General appearance: alert, cooperative and appears stated age  Abdomen: soft, no masses,  no organomegaly, mild tenderness with palpation of LLQ over sigmoid colon area.   Pelvic: External genitalia:  no lesions              Urethra:  normal appearing urethra with no masses, tenderness or lesions              Bartholins and Skenes: normal                 Vagina: normal appearing vagina with normal color and discharge, no lesions              Cervix: no lesions.  No CMT.                Bimanual Exam:  Uterus:  normal size, contour, position, consistency, mobility, non-tender              Adnexa: no mass, fullness, tenderness.              Rectal exam: Yes.  .  Confirms.              Anus:  normal sphincter tone, no lesions  Chaperone was present for exam.  Pelvic US Uterus no masses.  EMS 4.32 mm.  Ovaries normal.  Small right CL cyst, resolving.  No adnexal masses. No free fluid.  ASSESSMENT  LLQ pain.  HX endometriosis.  Rectal bleeding.  FH colon cancer.  PLAN  We discussed potential causes of the pain - endometriosis, renal stone, GI, musculoskeletal.  I am recommending GI consultation for further evaluation and treatment.  Expect colonoscopy due to her family history.  If pain persists, and GI evaluation is negative, I discussed potential medical or surgical treatment for potential endometriosis.

## 2019-09-06 NOTE — Progress Notes (Deleted)
GYNECOLOGY  VISIT   HPI: 34 y.o.   Married  Caucasian  female   [phone] with No LMP recorded.   here for     GYNECOLOGIC HISTORY: No LMP recorded. Contraception:  Vasectomy Menopausal hormone therapy:  n/a Last mammogram:  n/a Last pap smear: 12-01-18 Neg:Neg HR HPV, 2014 normal        OB History    Gravida  3   Para  2   Term  2   Preterm      AB  1   Living  2     SAB  1   TAB      Ectopic      Multiple      Live Births                 There are no problems to display for this patient.   Past Medical History:  Diagnosis Date  . Anxiety   . Dysmenorrhea   . Endometriosis   . Gastroparesis   . Irregular heart rhythm     Past Surgical History:  Procedure Laterality Date  . CESAREAN SECTION  2013, 2014  . PELVIC LAPAROSCOPY     x2 Dx'd with endometriosis in [place]    Current Outpatient Medications  Medication Sig Dispense Refill  . Cholecalciferol (VITAMIN D3) 125 MCG (5000 UT) CAPS Take 1 capsule by mouth daily.    Marland Kitchen ibuprofen (ADVIL) 800 MG tablet Take 1 tablet (800 mg total) by mouth every 8 (eight) hours as needed. 30 tablet 3  . lactobacillus acidophilus (BACID) TABS tablet Take 2 tablets by mouth 3 (three) times daily.    . metroNIDAZOLE (FLAGYL) 500 MG tablet Take 1 tablet (500 mg total) by mouth 2 (two) times daily. 14 tablet 0  . zinc gluconate 50 MG tablet Take 50 mg by mouth daily.     No current facility-administered medications for this visit.     ALLERGIES: Oxycodone-acetaminophen  Family History  Problem Relation Age of Onset  . Cancer Mother 56       Rectal ca  . Hyperlipidemia Father   . Stroke Father   . Diabetes Paternal Grandmother   . Hypertension Paternal Grandfather   . Hyperlipidemia Paternal Grandfather     Social History   Socioeconomic History  . Marital status: Married    Spouse name: Not on file  . Number of children: Not on file  . Years of education: Not on file  . Highest education level:  Not on file  Occupational History  . Not on file  Tobacco Use  . Smoking status: Never Smoker  . Smokeless tobacco: Never Used  Vaping Use  . Vaping Use: Never used  Substance and Sexual Activity  . Alcohol use: Yes    Alcohol/week: 2.0 standard drinks    Types: 2 Glasses of wine per week  . Drug use: Never  . Sexual activity: Yes    Comment: husband vasectomy  Other Topics Concern  . Not on file  Social History Narrative  . Not on file   Social Determinants of Health   Financial Resource Strain:   . Difficulty of Paying Living Expenses:   Food Insecurity:   . Worried About Programme researcher, broadcasting/film/video in the Last Year:   . Barista in the Last Year:   Transportation Needs:   . Freight forwarder (Medical):   Marland Kitchen Lack of Transportation (Non-Medical):   Physical Activity:   . Days of  Exercise per Week:   . Minutes of Exercise per Session:   Stress:   . Feeling of Stress :   Social Connections:   . Frequency of Communication with Friends and Family:   . Frequency of Social Gatherings with Friends and Family:   . Attends Religious Services:   . Active Member of Clubs or Organizations:   . Attends Banker Meetings:   Marland Kitchen Marital Status:   Intimate Partner Violence:   . Fear of Current or Ex-Partner:   . Emotionally Abused:   Marland Kitchen Physically Abused:   . Sexually Abused:     Review of Systems  PHYSICAL EXAMINATION:    There were no vitals taken for this visit.    General appearance: alert, cooperative and appears stated age Head: Normocephalic, without obvious abnormality, atraumatic Neck: no adenopathy, supple, symmetrical, trachea midline and thyroid normal to inspection and palpation Lungs: clear to auscultation bilaterally Breasts: normal appearance, no masses or tenderness, No nipple retraction or dimpling, No nipple discharge or bleeding, No axillary or supraclavicular adenopathy Heart: regular rate and rhythm Abdomen: soft, non-tender, no masses,   no organomegaly Extremities: extremities normal, atraumatic, no cyanosis or edema Skin: Skin color, texture, turgor normal. No rashes or lesions Lymph nodes: Cervical, supraclavicular, and axillary nodes normal. No abnormal inguinal nodes palpated Neurologic: Grossly normal  Pelvic: External genitalia:  no lesions              Urethra:  normal appearing urethra with no masses, tenderness or lesions              Bartholins and Skenes: normal                 Vagina: normal appearing vagina with normal color and discharge, no lesions              Cervix: no lesions                Bimanual Exam:  Uterus:  normal size, contour, position, consistency, mobility, non-tender              Adnexa: no mass, fullness, tenderness              Rectal exam: {yes no:[id number]}.  Confirms.              Anus:  normal sphincter tone, no lesions  Chaperone was present for exam.  ASSESSMENT     PLAN     An After Visit Summary was printed and given to the patient.  ______ minutes face to face time of which over 50% was spent in counseling.

## 2019-09-06 NOTE — Telephone Encounter (Signed)
Please facilitate in making an appointment for my patient to see Dr. Leone Payor, [facility] for LLQ pain, rectal bleeding, and FH of colon cancer.   I have placed an order.

## 2019-09-06 NOTE — Telephone Encounter (Signed)
Patient is calling in regards to severe cramping. Patient stated she would like to be seen today.

## 2019-09-07 ENCOUNTER — Ambulatory Visit: Payer: Self-pay | Admitting: Obstetrics and Gynecology

## 2019-09-07 NOTE — Telephone Encounter (Signed)
Please make a referral to Dr. Loreta Ave.

## 2019-09-07 NOTE — Addendum Note (Signed)
Addended by: Leda Min on: 09/07/2019 08:56 AM   Modules accepted: Orders

## 2019-09-07 NOTE — Telephone Encounter (Signed)
Spoke with Misty Stanley at dr. Kenna Gilbert office.  Patient scheduled for first available on 09/08/19 at 2:45pm.   Referral to [facility] cancelled.   Spoke with patient, advised of appt as seen above. Patient is agreeable to see Dr. Loreta Ave, will need to change appt time to meet her scheduling needs. Patient will contact Dr. Kenna Gilbert office directly, office number provided. Patient is aware to contact the office if any additional questions or assistance needed.   Routing to [employer] to send referral.   Encounter closed.

## 2019-09-07 NOTE — Telephone Encounter (Signed)
Call placed to  GI, spoke with Johnny Bridge.  Was advised first available with at least November, was advised this schedule is not available to schedule at this time.  RN requested first available with PA or NP, was advised first available with any provider is 10/18 with Dr. Orvan Falconer. Advised I will review with provider and return call.  Dr. Edward Jolly -please review, ok to schedule in 10/2019?

## 2019-10-28 ENCOUNTER — Other Ambulatory Visit: Payer: Self-pay | Admitting: Gastroenterology

## 2019-10-28 DIAGNOSIS — K639 Disease of intestine, unspecified: Secondary | ICD-10-CM

## 2019-11-11 ENCOUNTER — Ambulatory Visit
Admission: RE | Admit: 2019-11-11 | Discharge: 2019-11-11 | Disposition: A | Discharge status: Home / Self Care | Payer: No Typology Code available for payment source | Source: Ambulatory Visit | Attending: Gastroenterology | Admitting: Gastroenterology

## 2019-11-11 DIAGNOSIS — K639 Disease of intestine, unspecified: Secondary | ICD-10-CM

## 2019-11-11 MED ORDER — IOPAMIDOL (ISOVUE-300) INJECTION 61%
100.0000 mL | Freq: Once | INTRAVENOUS | Status: AC | PRN
  Administered 2019-11-11: 100 mL via INTRAVENOUS

## 2019-12-01 ENCOUNTER — Ambulatory Visit: Payer: Self-pay | Admitting: Obstetrics and Gynecology

## 2019-12-02 ENCOUNTER — Other Ambulatory Visit: Payer: Self-pay

## 2020-10-27 IMAGING — CT CT ABD-PELV W/ CM
1 of 2 series · 14 of 32 positions shown, 19 images · IV contrast (iopamidol)
Comparison: Pelvic ultrasound 09/06/2019. Upper GI series
04/21/2007.

CLINICAL DATA: Nodule seen on cecum during colonoscopy last month.
History of endometriosis.

EXAM:
CT ABDOMEN AND PELVIS WITH CONTRAST
TECHNIQUE: Multidetector CT imaging of the abdomen and pelvis was performed
using the standard protocol following bolus administration of
intravenous contrast.
CONTRAST:  100mL 04TWM3-UGG IOPAMIDOL (04TWM3-UGG) INJECTION 61%

[Series 2: abd/pelvis w/cm · axial · 0.80mm/px · z∈[-540,-155]mm · 14 of 87 slices shown, 19 images]
[im 5/87  soft-tissue]
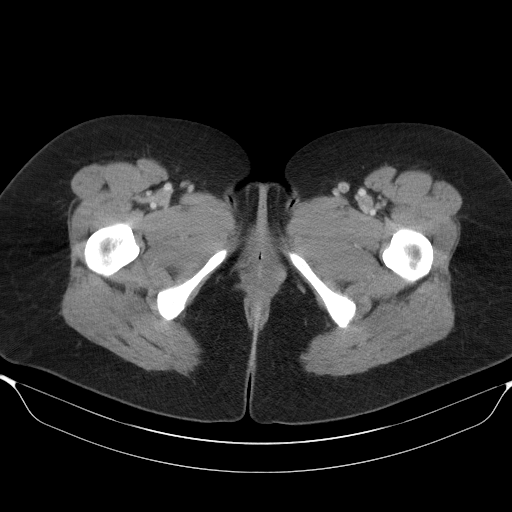
[im 5/87  bone]
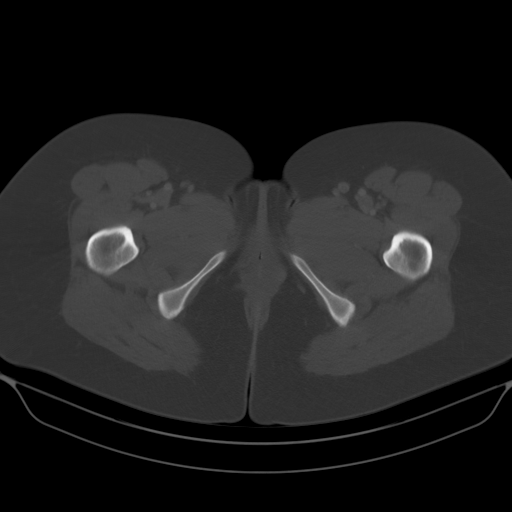
[im 13/87  soft-tissue]
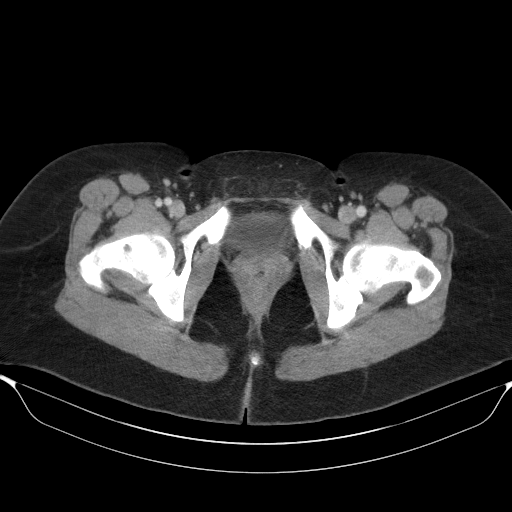
[im 17/87  soft-tissue]
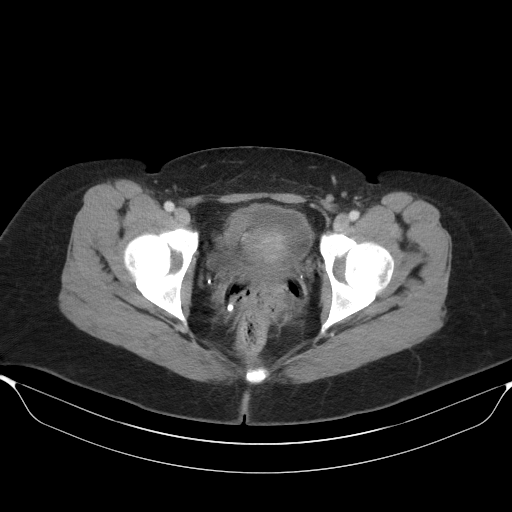
[im 25/87  soft-tissue]
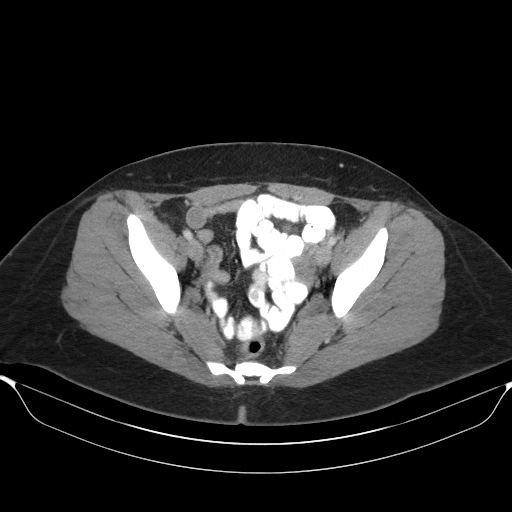
[im 29/87  soft-tissue]
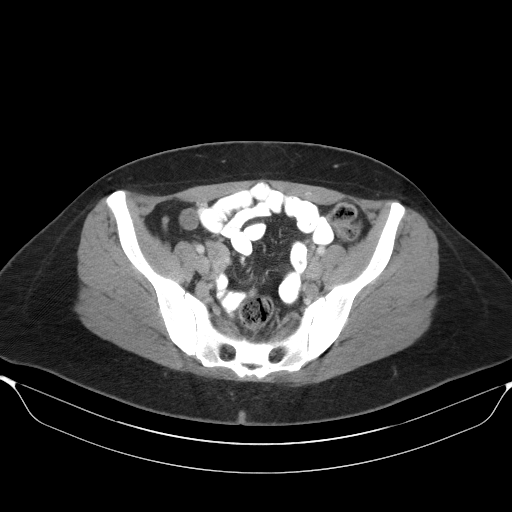
[im 37/87  soft-tissue]
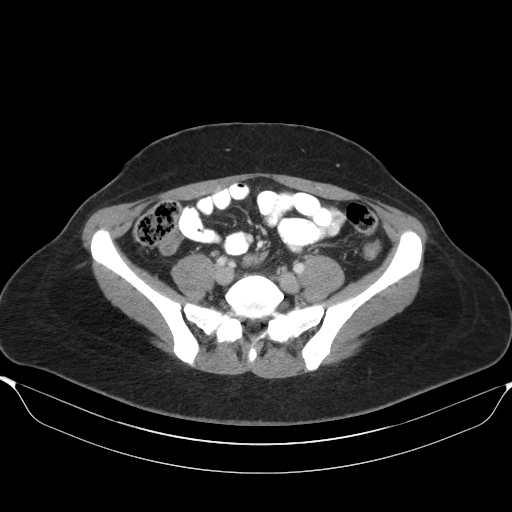
[im 46/87  soft-tissue]
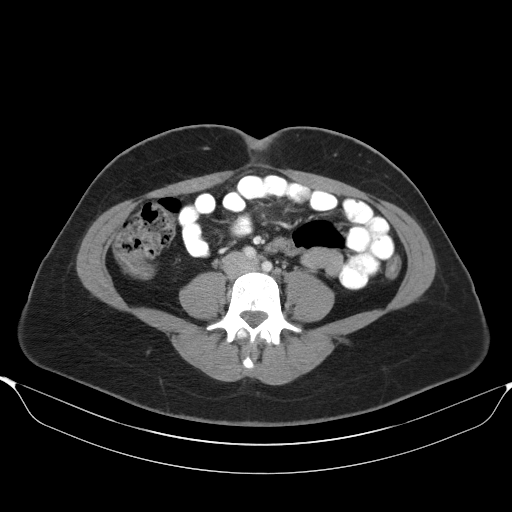
[im 50/87  soft-tissue]
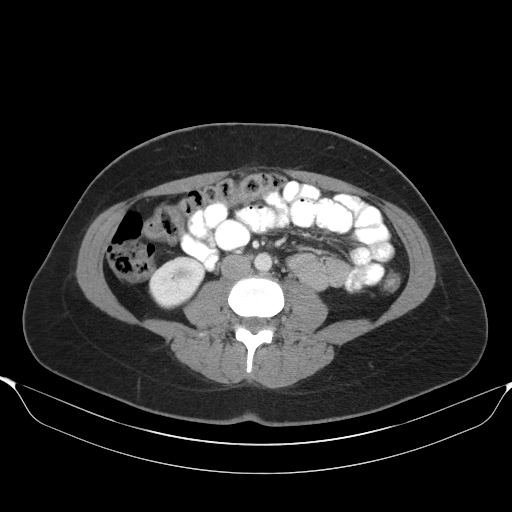
[im 58/87  soft-tissue]
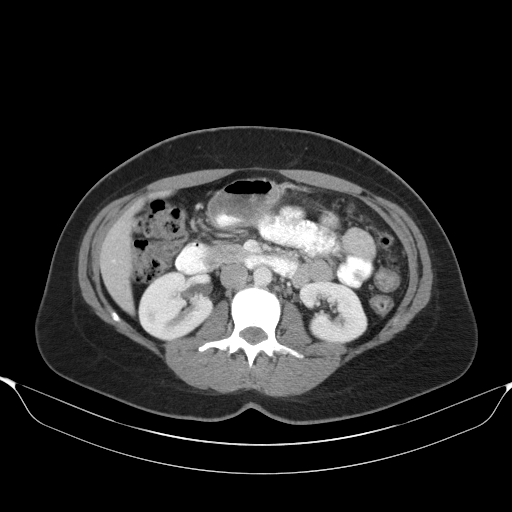
[im 58/87  bone]
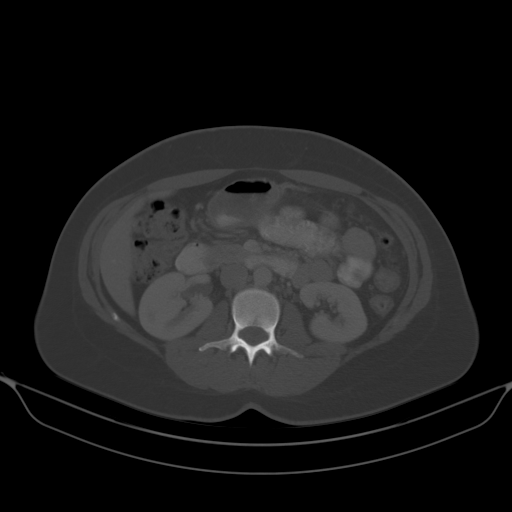
[im 62/87  soft-tissue]
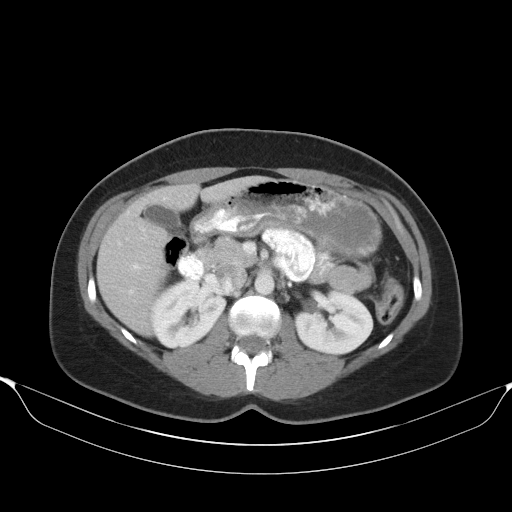
[im 70/87  soft-tissue]
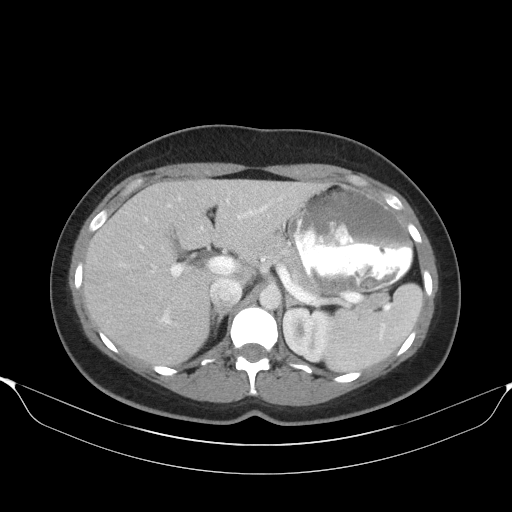
[im 70/87  lung]
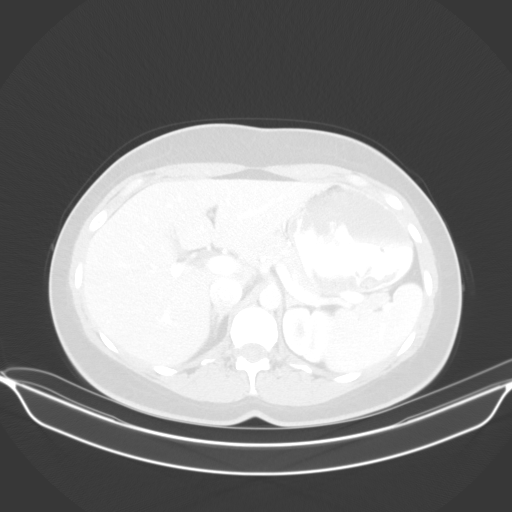
[im 74/87  soft-tissue]
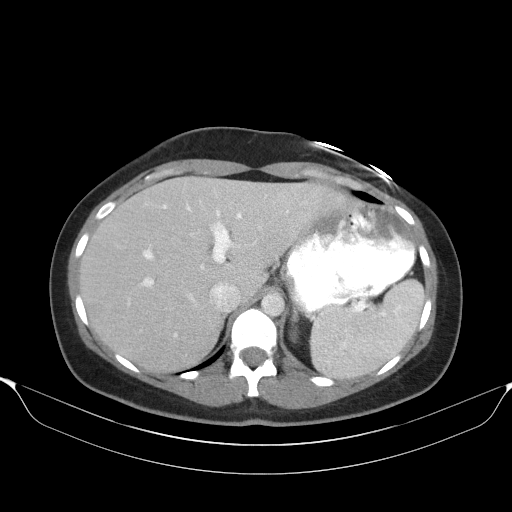
[im 74/87  lung]
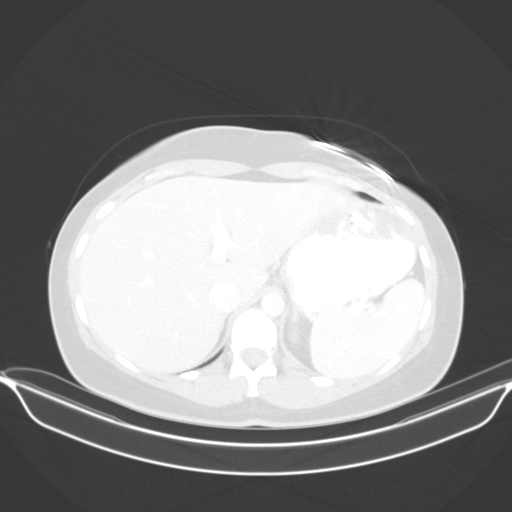
[im 78/87  lung]
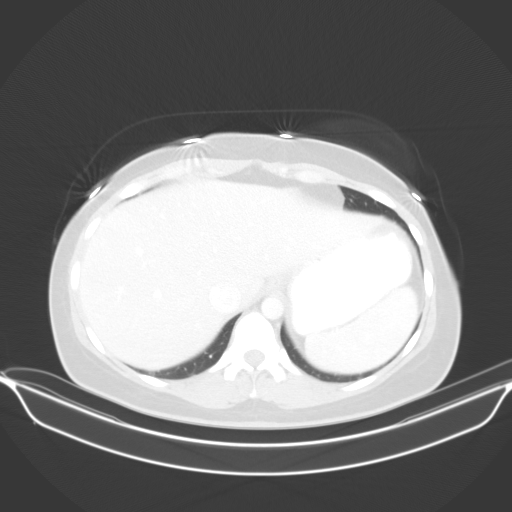
[im 82/87  soft-tissue]
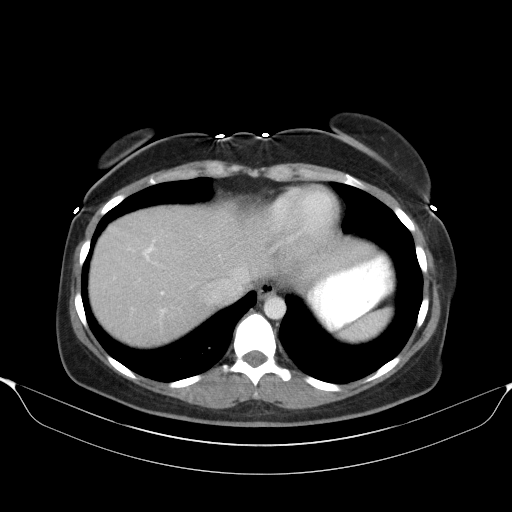
[im 82/87  lung]
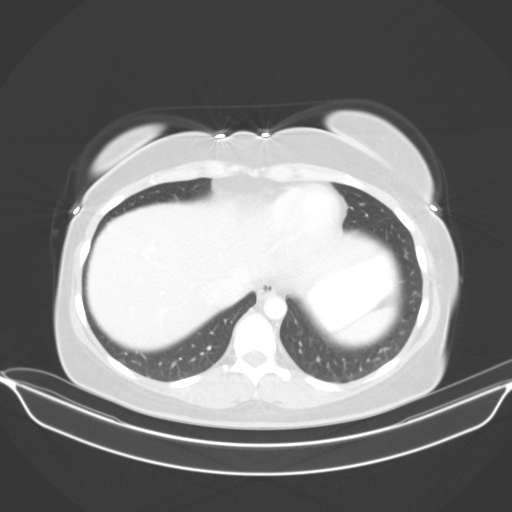

[14 of 32 positions shown; findings below may reference images not displayed]

FINDINGS: Lower chest: Clear lung bases. No significant pleural or pericardial
effusion.

Hepatobiliary: The liver is normal in density without suspicious
focal abnormality. No evidence of gallstones, gallbladder wall
thickening or biliary dilatation.

Pancreas: Unremarkable. No pancreatic ductal dilatation or
surrounding inflammatory changes.

Spleen: Normal in size without focal abnormality.

Adrenals/Urinary Tract: Both adrenal glands appear normal. The
kidneys appear normal without evidence of urinary tract calculus,
suspicious lesion or hydronephrosis. No bladder abnormalities are
seen.

Stomach/Bowel: No evidence of bowel wall thickening, distention or
surrounding inflammatory change. The terminal ileum, appendix and
cecum appear normal. No surrounding mass, fluid collection or
inflammatory process identified.

Vascular/Lymphatic: There are no enlarged abdominal or pelvic lymph
nodes. No significant vascular findings.

Reproductive: The uterus and ovaries appear normal. No adnexal mass.

Other: No evidence of abdominal wall mass or hernia. No ascites.

Musculoskeletal: No acute or significant osseous findings.
IMPRESSION: Normal abdominopelvic CT. No explanation for the reported concerns
on colonoscopy. The uterus and ovaries appear normal.

## 2020-11-12 ENCOUNTER — Encounter (HOSPITAL_BASED_OUTPATIENT_CLINIC_OR_DEPARTMENT_OTHER): Payer: Self-pay | Admitting: Emergency Medicine

## 2020-11-12 ENCOUNTER — Emergency Department (HOSPITAL_BASED_OUTPATIENT_CLINIC_OR_DEPARTMENT_OTHER)
Admission: EM | Admit: 2020-11-12 | Discharge: 2020-11-12 | Disposition: A | Discharge status: Home / Self Care | Payer: PRIVATE HEALTH INSURANCE | Attending: Student | Admitting: Student

## 2020-11-12 ENCOUNTER — Other Ambulatory Visit: Payer: Self-pay

## 2020-11-12 DIAGNOSIS — R519 Headache, unspecified: Secondary | ICD-10-CM | POA: Diagnosis present

## 2020-11-12 DIAGNOSIS — G43809 Other migraine, not intractable, without status migrainosus: Secondary | ICD-10-CM | POA: Diagnosis not present

## 2020-11-12 MED ORDER — PROCHLORPERAZINE EDISYLATE 10 MG/2ML IJ SOLN
10.0000 mg | Freq: Once | INTRAMUSCULAR | Status: AC
  Administered 2020-11-12: 10 mg via INTRAVENOUS
  Filled 2020-11-12: qty 2

## 2020-11-12 MED ORDER — LACTATED RINGERS IV BOLUS
1000.0000 mL | Freq: Once | INTRAVENOUS | Status: AC
  Administered 2020-11-12: 1000 mL via INTRAVENOUS

## 2020-11-12 MED ORDER — DIPHENHYDRAMINE HCL 50 MG/ML IJ SOLN
25.0000 mg | Freq: Once | INTRAMUSCULAR | Status: AC
  Administered 2020-11-12: 25 mg via INTRAVENOUS
  Filled 2020-11-12: qty 1

## 2020-11-12 MED ORDER — KETOROLAC TROMETHAMINE 15 MG/ML IJ SOLN
15.0000 mg | Freq: Once | INTRAMUSCULAR | Status: AC
  Administered 2020-11-12: 15 mg via INTRAVENOUS
  Filled 2020-11-12: qty 1

## 2020-11-12 NOTE — ED Triage Notes (Signed)
Pt reports headache, pain behind eyes, and nauseous since Saturday. Approx 5 episodes of emesis, once today.  Pt h/o migraines and states this feels similar to past episodes.

## 2020-11-12 NOTE — ED Provider Notes (Signed)
[facility] EMERGENCY DEPT Provider Note   CSN: [id number] Arrival date & time: 11/12/20  0946     History Chief Complaint  Patient presents with   Migraine    Jeanne Conway is a 35 y.o. female with PMH migraine headaches, endometriosis, gastroparesis who presents the emergency department for evaluation of a headache.  Patient states that she was recently traveling in [place] and upon return to the noted states, she has had difficulty sleeping and had increased stress with managing her children.  She states that she has had chills and a headache which is consistent with her previous migraine pattern.  She endorses 5 episodes of emesis but only 1 today.  Denies chest pain, shortness of breath, abdominal pain or other systemic symptoms.   Migraine Associated symptoms include headaches. Pertinent negatives include no chest pain, no abdominal pain and no shortness of breath.      Past Medical History:  Diagnosis Date   Anxiety    Dysmenorrhea    Endometriosis    Gastroparesis    Irregular heart rhythm     There are no problems to display for this patient.   Past Surgical History:  Procedure Laterality Date   CESAREAN SECTION  2013, 2014   PELVIC LAPAROSCOPY     x2 Dx'd with endometriosis in [place]     OB History     Gravida  3   Para  2   Term  2   Preterm      AB  1   Living  2      SAB  1   IAB      Ectopic      Multiple      Live Births              Family History  Problem Relation Age of Onset   Cancer Mother 24       Rectal ca   Hyperlipidemia Father    Stroke Father    Diabetes Paternal Grandmother    Hypertension Paternal Grandfather    Hyperlipidemia Paternal Grandfather     Social History   Tobacco Use   Smoking status: Never   Smokeless tobacco: Never  Vaping Use   Vaping Use: Never used  Substance Use Topics   Alcohol use: Yes    Alcohol/week: 2.0 standard drinks    Types: 2 Glasses of wine per  week   Drug use: Never    Home Medications Prior to Admission medications   Medication Sig Start Date End Date Taking? Authorizing Provider  ibuprofen (ADVIL) 800 MG tablet Take 1 tablet (800 mg total) by mouth every 8 (eight) hours as needed. 12/01/18   Patton Salles, MD  lactobacillus acidophilus (BACID) TABS tablet Take 2 tablets by mouth 3 (three) times daily.    [provider]  Multiple Vitamin (MULTIVITAMIN) capsule Take 1 capsule by mouth daily.    [provider]    Allergies    Oxycodone-acetaminophen  Review of Systems   Review of Systems  Constitutional:  Negative for chills and fever.  HENT:  Negative for ear pain and sore throat.   Eyes:  Negative for pain and visual disturbance.  Respiratory:  Negative for cough and shortness of breath.   Cardiovascular:  Negative for chest pain and palpitations.  Gastrointestinal:  Negative for abdominal pain and vomiting.  Genitourinary:  Negative for dysuria and hematuria.  Musculoskeletal:  Negative for arthralgias and back pain.  Skin:  Negative for color change and rash.  Neurological:  Positive for headaches. Negative for seizures and syncope.  All other systems reviewed and are negative.  Physical Exam Updated Vital Signs BP 108/71 (BP Location: Right Arm)   Pulse 71   Temp 98.8 F (37.1 C) (Oral)   Resp 16   LMP 10/27/2020 (Approximate)   SpO2 98%   Physical Exam Vitals and nursing note reviewed.  Constitutional:      General: She is not in acute distress.    Appearance: She is well-developed.  HENT:     Head: Normocephalic and atraumatic.  Eyes:     Conjunctiva/sclera: Conjunctivae normal.  Cardiovascular:     Rate and Rhythm: Normal rate and regular rhythm.     Heart sounds: No murmur heard. Pulmonary:     Effort: Pulmonary effort is normal. No respiratory distress.     Breath sounds: Normal breath sounds.  Abdominal:     Palpations: Abdomen is soft.     Tenderness: There  is no abdominal tenderness.  Musculoskeletal:     Cervical back: Neck supple.  Skin:    General: Skin is warm and dry.  Neurological:     Mental Status: She is alert.    ED Results / Procedures / Treatments   Labs (all labs ordered are listed, but only abnormal results are displayed) Labs Reviewed - No data to display  EKG None  Radiology No results found.  Procedures Procedures   Medications Ordered in ED Medications  prochlorperazine (COMPAZINE) injection 10 mg (10 mg Intravenous Given 11/12/20 1059)  diphenhydrAMINE (BENADRYL) injection 25 mg (25 mg Intravenous Given 11/12/20 1059)  ketorolac (TORADOL) 15 MG/ML injection 15 mg (15 mg Intravenous Given 11/12/20 1059)  lactated ringers bolus 1,000 mL (0 mLs Intravenous Stopped 11/12/20 1218)    ED Course  I have reviewed the triage vital signs and the nursing notes.  Pertinent labs & imaging results that were available during my care of the patient were reviewed by me and considered in my medical decision making (see chart for details).    MDM Rules/Calculators/A&P                           Patient seen emergency department for evaluation of a headache.  Physical exam is unremarkable.  As the patient's presentation is consistent with her previous episodes of migraines, she was given a migraine cocktail which led to resolution of her symptoms.  On reevaluation, patient states symptoms have improved and she was discharged with primary care follow-up. Final Clinical Impression(s) / ED Diagnoses Final diagnoses:  Other migraine without status migrainosus, not intractable    Rx / DC Orders ED Discharge Orders     None        Correen Bubolz, MD 11/12/20 1610

## 2020-11-12 NOTE — ED Triage Notes (Signed)
Pt also reports fever of 102F on Sunday morning.
